# Patient Record
Sex: Male | Born: 1985 | Race: Black or African American | Hispanic: No | Marital: Single | Smoking: Current every day smoker
Health system: Southern US, Community
[De-identification: ages and names within clinical notes are randomized; demographics above are authoritative.]

---

## 2003-12-14 ENCOUNTER — Emergency Department (HOSPITAL_COMMUNITY): Admission: EM | Admit: 2003-12-14 | Discharge: 2003-12-14 | Payer: Self-pay | Admitting: Emergency Medicine

## 2004-08-04 ENCOUNTER — Emergency Department (HOSPITAL_COMMUNITY): Admission: EM | Admit: 2004-08-04 | Discharge: 2004-08-04 | Payer: Self-pay | Admitting: Emergency Medicine

## 2005-08-21 ENCOUNTER — Emergency Department (HOSPITAL_COMMUNITY): Admission: EM | Admit: 2005-08-21 | Discharge: 2005-08-21 | Payer: Self-pay | Admitting: *Deleted

## 2005-09-04 ENCOUNTER — Emergency Department (HOSPITAL_COMMUNITY): Admission: EM | Admit: 2005-09-04 | Discharge: 2005-09-04 | Payer: Self-pay | Admitting: Emergency Medicine

## 2005-09-05 ENCOUNTER — Emergency Department (HOSPITAL_COMMUNITY): Admission: EM | Admit: 2005-09-05 | Discharge: 2005-09-05 | Payer: Self-pay | Admitting: Emergency Medicine

## 2006-05-24 ENCOUNTER — Emergency Department (HOSPITAL_COMMUNITY): Admission: EM | Admit: 2006-05-24 | Discharge: 2006-05-24 | Payer: Self-pay | Admitting: Emergency Medicine

## 2008-03-26 IMAGING — CT CT HEAD W/O CM
3 of 4 series · 16 of 47 positions shown, 19 images · IV contrast (agent unspecified)
Comparison: No prior studies.

CLINICAL DATA: The patient fell and has facial and head pain along with photophobia.  
HEAD CT WITHOUT CONTRAST:
TECHNIQUE: Contiguous axial images were obtained from the base of the skull through the vertex according to standard protocol without contrast.
TECHNIQUE: Axial and coronal plane CT imaging was performed through the orbits.  No intravenous contrast was administered.

[Series 2: trauma head · axial · 0.47mm/px · z∈[+136,+263]mm · 10 of 28 slices shown, 13 images]
[im 2/28  brain]
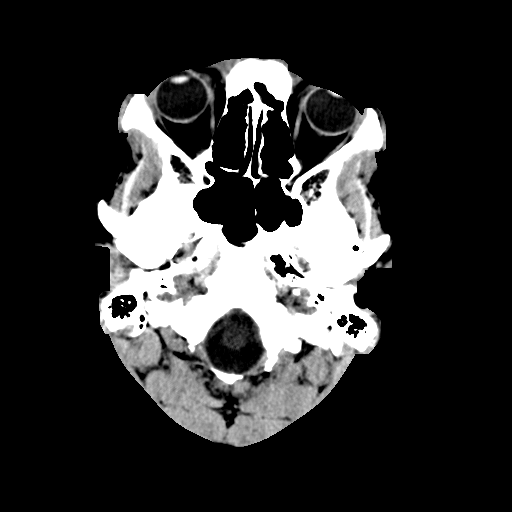
[im 2/28  bone]
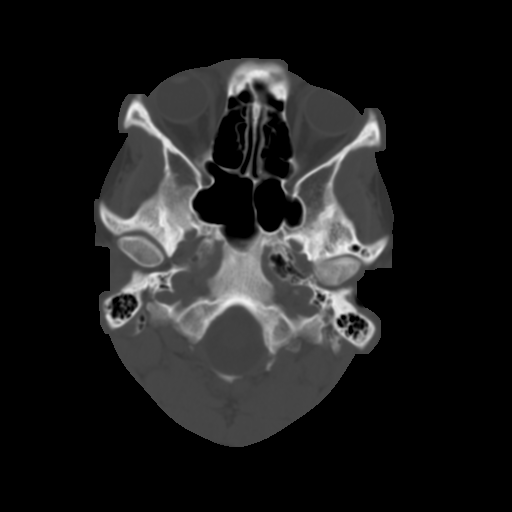
[im 4/28  brain]
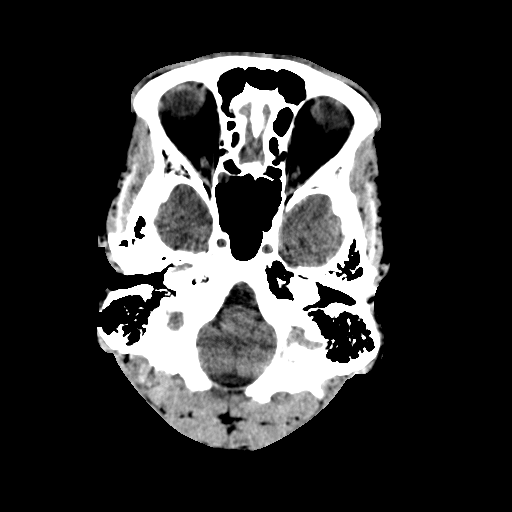
[im 8/28  brain]
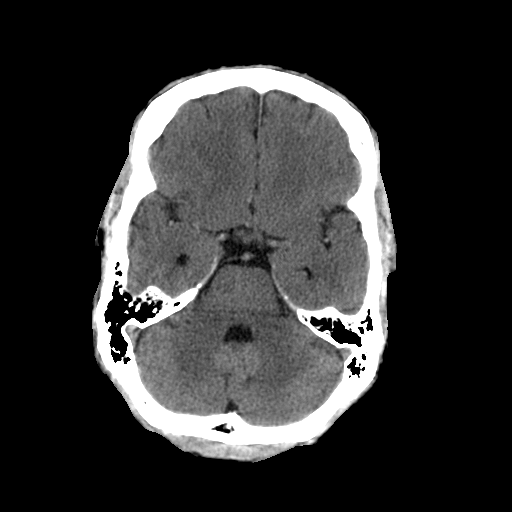
[im 10/28  brain]
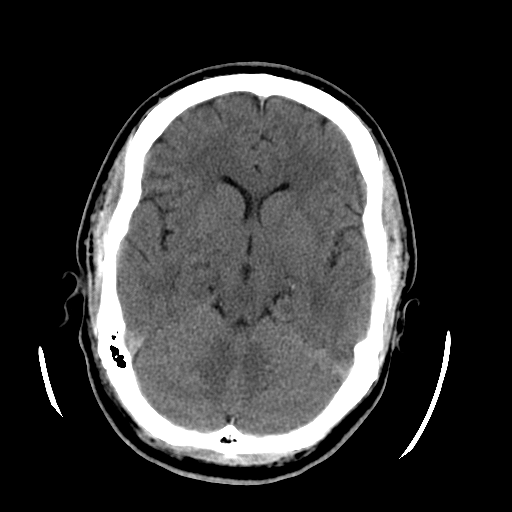
[im 12/28  brain]
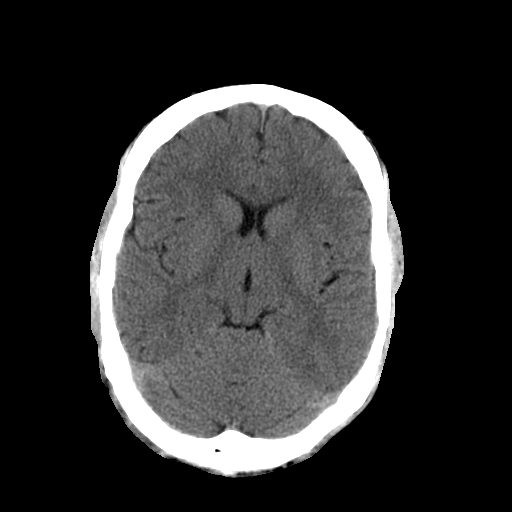
[im 12/28  bone]
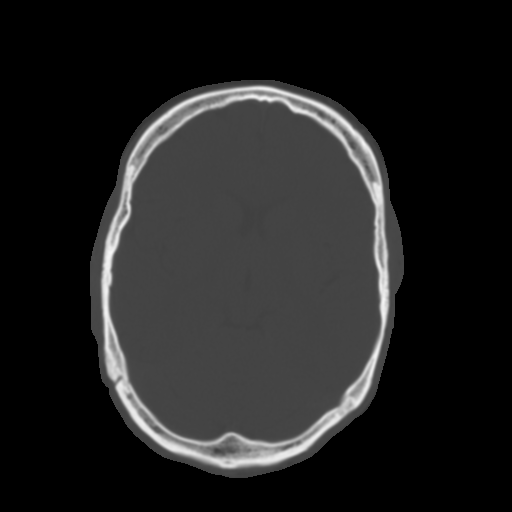
[im 16/28  brain]
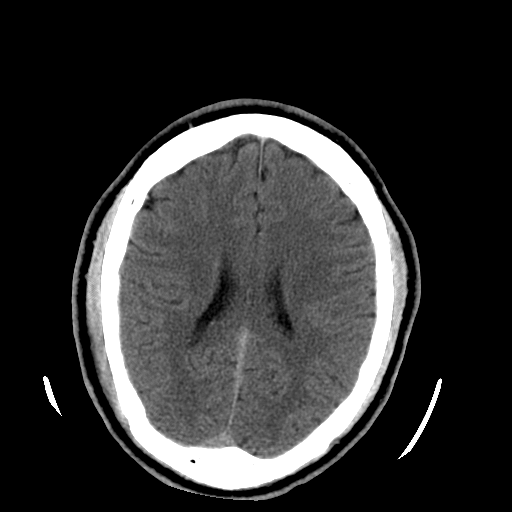
[im 18/28  brain]
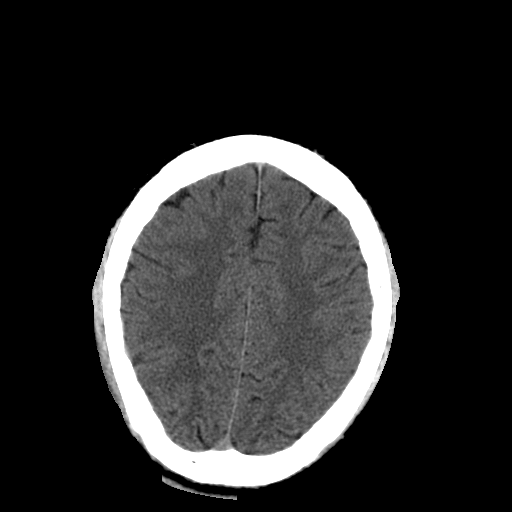
[im 20/28  brain]
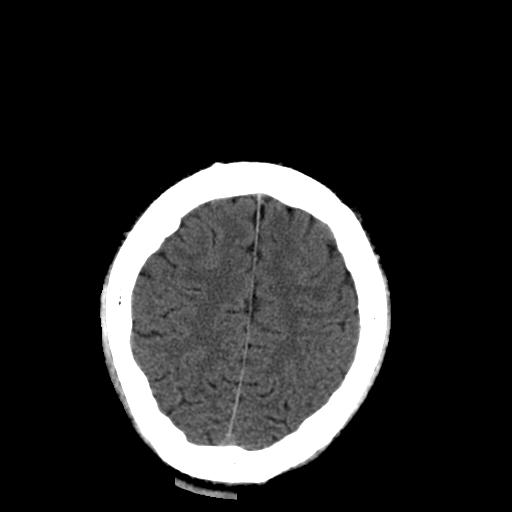
[im 24/28  brain]
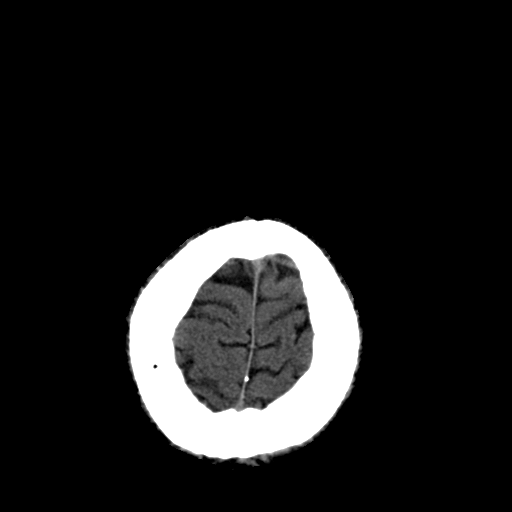
[im 24/28  bone]
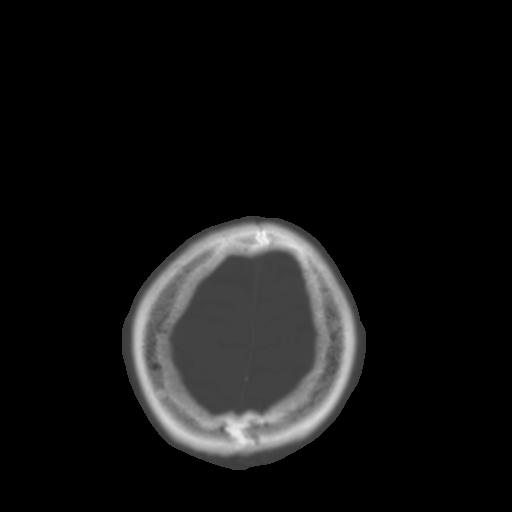
[im 26/28  brain]
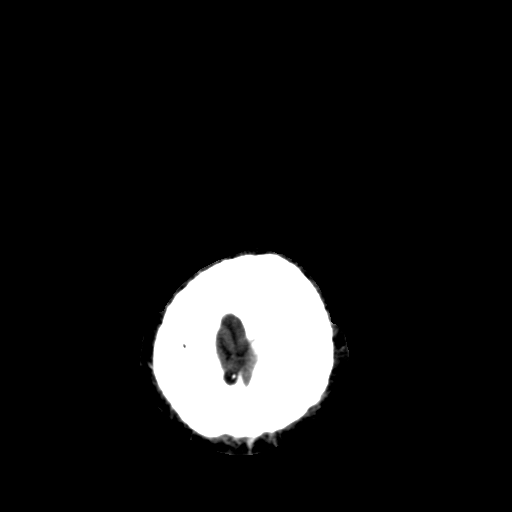

[Series 600: reformatted · sagittal · 0.33mm/px · 3 of 65 slices shown (1 of 2)]
[im 22/65  brain]
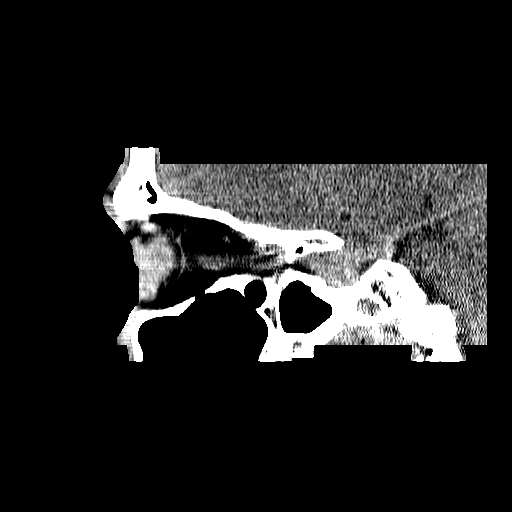
[im 33/65  brain]
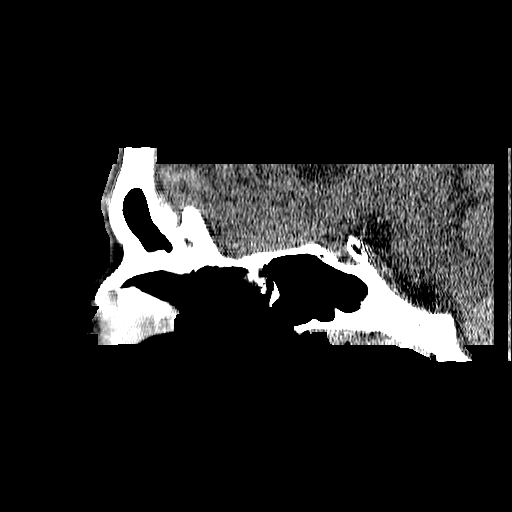
[im 43/65  brain]
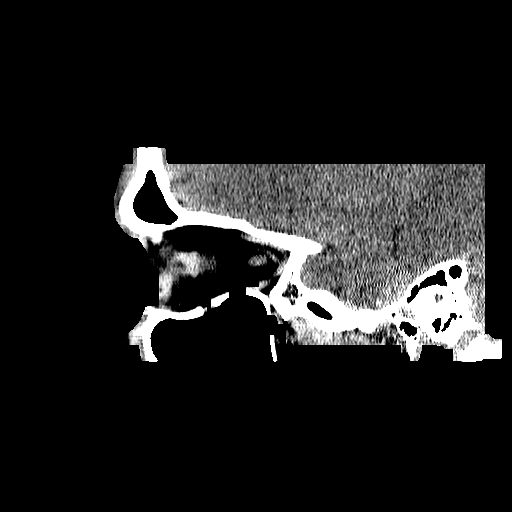

[Series 601: reformatted · coronal · 0.33mm/px · 3 of 45 slices shown (2 of 2)]
[im 15/45  brain]
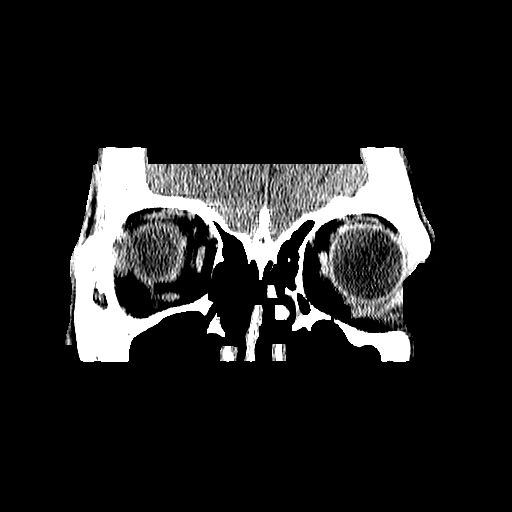
[im 20/45  brain]
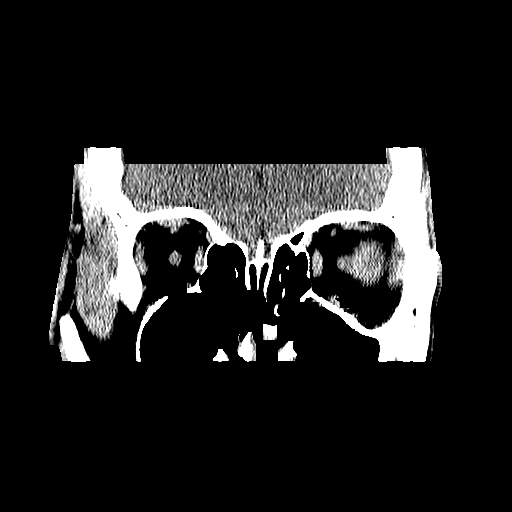
[im 25/45  brain]
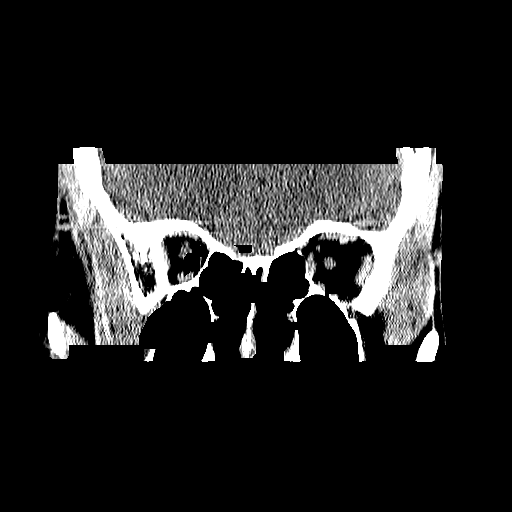

[16 of 47 positions shown; findings below may reference images not displayed]

FINDINGS: There is a small mucous retention cyst in the right sphenoid sinus.  There is also a suggestion of periorbital soft tissue swelling, without involvement of the intraorbital tissues.  No intracranial hemorrhage, mass lesion, or acute CVA is identified.  No acute intracranial findings are noted.
IMPRESSION: 1.  Mild chronic sinusitis.  
2.  Periorbital soft tissue swelling bilaterally.  
3.  No acute intracranial findings.
CT OF THE ORBITS WITHOUT CONTRAST:
FINDINGS: Periorbital soft tissue swelling is present bilaterally, right greater than left.  No extension into the orbit itself is identified.  The optic nerves and intraconal structures appear normal.  The globes appear grossly intact.  There is mild chronic right sphenoid and left maxillary sinusitis.  
There is a nondisplaced fracture of the right nasal bone.
IMPRESSION: 1.  Periorbital soft tissue swelling.  Nondisplaced fracture of the right nasal bone.    No orbital fracture is identified. 
2.  Mild chronic paranasal sinusitis.

## 2012-08-22 ENCOUNTER — Emergency Department (HOSPITAL_COMMUNITY)
Admission: EM | Admit: 2012-08-22 | Discharge: 2012-08-22 | Disposition: A | Discharge status: Home / Self Care | Payer: Self-pay | Attending: Emergency Medicine | Admitting: Emergency Medicine

## 2012-08-22 ENCOUNTER — Encounter (HOSPITAL_COMMUNITY): Payer: Self-pay | Admitting: *Deleted

## 2012-08-22 DIAGNOSIS — R21 Rash and other nonspecific skin eruption: Secondary | ICD-10-CM | POA: Insufficient documentation

## 2012-08-22 DIAGNOSIS — S40021A Contusion of right upper arm, initial encounter: Secondary | ICD-10-CM

## 2012-08-22 DIAGNOSIS — S61219A Laceration without foreign body of unspecified finger without damage to nail, initial encounter: Secondary | ICD-10-CM

## 2012-08-22 DIAGNOSIS — S40029A Contusion of unspecified upper arm, initial encounter: Secondary | ICD-10-CM | POA: Insufficient documentation

## 2012-08-22 DIAGNOSIS — F17200 Nicotine dependence, unspecified, uncomplicated: Secondary | ICD-10-CM | POA: Insufficient documentation

## 2012-08-22 DIAGNOSIS — S61209A Unspecified open wound of unspecified finger without damage to nail, initial encounter: Secondary | ICD-10-CM | POA: Insufficient documentation
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
## 2012-08-24 NOTE — ED Provider Notes (Signed)
Medical screening examination/treatment/procedure(s) were performed by non-physician practitioner and as supervising physician I was immediately available for consultation/collaboration.  Lanisha Stepanian R. Shae Augello, MD 08/24/12 0013 

## 2018-01-20 ENCOUNTER — Emergency Department (HOSPITAL_COMMUNITY)
Admission: EM | Admit: 2018-01-20 | Discharge: 2018-01-20 | Disposition: A | Discharge status: Home / Self Care | Payer: Self-pay | Attending: Emergency Medicine | Admitting: Emergency Medicine

## 2018-01-20 ENCOUNTER — Encounter (HOSPITAL_COMMUNITY): Payer: Self-pay

## 2018-01-20 ENCOUNTER — Other Ambulatory Visit: Payer: Self-pay

## 2018-01-20 DIAGNOSIS — F17210 Nicotine dependence, cigarettes, uncomplicated: Secondary | ICD-10-CM | POA: Insufficient documentation

## 2018-01-20 DIAGNOSIS — K0889 Other specified disorders of teeth and supporting structures: Secondary | ICD-10-CM

## 2018-01-20 DIAGNOSIS — K047 Periapical abscess without sinus: Secondary | ICD-10-CM | POA: Insufficient documentation

## 2018-01-20 MED ORDER — CLINDAMYCIN HCL 150 MG PO CAPS: 450.0000 mg | ORAL_CAPSULE | Freq: Three times a day (TID) | ORAL | 0 refills | Status: AC

## 2018-01-20 MED ORDER — HYDROCODONE-ACETAMINOPHEN 5-325 MG PO TABS
1.0000 | ORAL_TABLET | Freq: Once | ORAL | Status: AC
  Administered 2018-01-20: 1 via ORAL
  Filled 2018-01-20: qty 1

## 2018-01-20 NOTE — Discharge Instructions (Signed)
I have prescribed antibiotics for your pain, please take 3 tablets three times a day for the next 7 days.I have provided a referral for the dental clinic at [facility], please schedule an appointment for your dental needs.

## 2018-01-20 NOTE — ED Provider Notes (Signed)
[facility]-EMERGENCY DEPT Provider Note   CSN: [id number] Arrival date & time: 01/20/18  4696     History   Chief Complaint Chief Complaint  Patient presents with  . Dental Pain  . Oral Swelling    HPI Eric Mathis is a 32 y.o. male.  33 y/o male with a no PMH presents to the ED with a chief complaint of right tooth pain which began two days ago.Patient reports pain on the lower aspect of the right side of his mouth.  Reports the pain is worse with talking, mastication but he is able to tolerate liquids and solids at this time.  Reports the pain radiates to the lower aspect of his neck.  He has tried ibuprofen 800 mg along with Tylenol but states no relieving symptoms.  He also reports he had a fever yesterday but did not measure his temperature as he felt hot, subjective.  He denies any difficulty swallowing, facial swelling,headache. Patient has not been to a dentist in "couple of years".      History reviewed. No pertinent past medical history.  There are no active problems to display for this patient.   History reviewed. No pertinent surgical history.      Home Medications    Prior to Admission medications   Medication Sig Start Date End Date Taking? Authorizing Provider  clindamycin (CLEOCIN) 150 MG capsule Take 3 capsules (450 mg total) by mouth 3 (three) times daily for 7 days. 01/20/18 01/27/18  Claude Manges, PA-C    Family History History reviewed. No pertinent family history.  Social History Social History   Tobacco Use  . Smoking status: Current Every Day Smoker    Packs/day: 0.50    Types: Cigarettes  . Smokeless tobacco: Never Used  Substance Use Topics  . Alcohol use: No  . Drug use: No     Allergies   Patient has no known allergies.   Review of Systems Review of Systems  Constitutional: Positive for fever (subjective, none recordered in ED). Negative for chills.  HENT: Positive for dental problem. Negative for  trouble swallowing and voice change.   All other systems reviewed and are negative.    Physical Exam Updated Vital Signs BP (!) 158/93 (BP Location: Right Arm)   Pulse 64   Temp 98.2 F (36.8 C) (Oral)   Resp 16   Ht 5\' 7"  (1.702 m)   Wt 122.5 kg   SpO2 100%   BMI 42.29 kg/m   Physical Exam  Constitutional: He is oriented to person, place, and time. He appears well-developed and well-nourished.  HENT:  Mouth/Throat: Uvula is midline, oropharynx is clear and moist and mucous membranes are normal. Abnormal dentition. Dental abscesses and dental caries present. No uvula swelling. No tonsillar exudate.    There are a couple of broken teeth in the back right side of his mouth erythema present surrounding teeth region. A small periapical abscess is present on the gum line.   Neck: Normal range of motion and full passive range of motion without pain. Neck supple. No neck rigidity. Normal range of motion present.  Cardiovascular: Normal heart sounds.  Pulmonary/Chest: Breath sounds normal. He has no wheezes.  Abdominal: Soft. There is no tenderness.  Musculoskeletal: He exhibits no tenderness.  Lymphadenopathy:       Head (right side): Submandibular adenopathy present. No submental and no tonsillar adenopathy present.       Right cervical: No superficial cervical, no deep cervical and no posterior cervical  adenopathy present. Submandibular adenopathy present, tenderness to palpation.   Neurological: He is alert and oriented to person, place, and time.  Skin: Skin is warm and dry.  Nursing note and vitals reviewed.    ED Treatments / Results  Labs (all labs ordered are listed, but only abnormal results are displayed) Labs Reviewed - No data to display  EKG None  Radiology No results found.  Procedures Procedures (including critical care time)  Medications Ordered in ED Medications  HYDROcodone-acetaminophen (NORCO/VICODIN) 5-325 MG per tablet 1 tablet (1 tablet Oral  Given 01/20/18 1610)     Initial Impression / Assessment and Plan / ED Course  I have reviewed the triage vital signs and the nursing notes.  Pertinent labs & imaging results that were available during my care of the patient were reviewed by me and considered in my medical decision making (see chart for details).    Patient presents with dental pain which began two days ago.Upon examination there is erythema surrounding both of his chipped teeth, there is a small periapical abscess on the gumline.  Patient states he was febrile yesterday but does not recall his temperature as he did not measure it with a thermometer but reports feeling hot. He has not seen a dentist in multiple years.  He also reports some right submandibular swelling there is no tonsillar lymphadenopathy present.  There are no tonsillar lymphadenopathy present upon examination  At this time will provide patient with pain relief while in the ED along with sending home on antibiotics and a referral to the [facility] dental clinic as patient does not have resources to obtain dental care.  Patient understands and agrees with plan.  Return precautions provided for patient at length.  Final Clinical Impressions(s) / ED Diagnoses   Final diagnoses:  Dental abscess  Pain, dental    ED Discharge Orders         Ordered    clindamycin (CLEOCIN) 150 MG capsule  3 times daily     01/20/18 0919           Claude Manges, PA-C 01/20/18 9604    Derwood Kaplan, MD 01/21/18 [phone]

## 2018-10-31 ENCOUNTER — Emergency Department (HOSPITAL_COMMUNITY)
Admission: EM | Admit: 2018-10-31 | Discharge: 2018-10-31 | Disposition: A | Discharge status: Home / Self Care | Payer: Worker's Compensation | Attending: Emergency Medicine | Admitting: Emergency Medicine

## 2018-10-31 ENCOUNTER — Emergency Department (HOSPITAL_COMMUNITY): Payer: Worker's Compensation

## 2018-10-31 ENCOUNTER — Other Ambulatory Visit: Payer: Self-pay

## 2018-10-31 DIAGNOSIS — Y939 Activity, unspecified: Secondary | ICD-10-CM | POA: Insufficient documentation

## 2018-10-31 DIAGNOSIS — S99911A Unspecified injury of right ankle, initial encounter: Secondary | ICD-10-CM | POA: Diagnosis present

## 2018-10-31 DIAGNOSIS — S93401A Sprain of unspecified ligament of right ankle, initial encounter: Secondary | ICD-10-CM | POA: Insufficient documentation

## 2018-10-31 DIAGNOSIS — Y999 Unspecified external cause status: Secondary | ICD-10-CM | POA: Diagnosis not present

## 2018-10-31 DIAGNOSIS — Y929 Unspecified place or not applicable: Secondary | ICD-10-CM | POA: Insufficient documentation

## 2018-10-31 DIAGNOSIS — X501XXA Overexertion from prolonged static or awkward postures, initial encounter: Secondary | ICD-10-CM | POA: Diagnosis not present

## 2018-10-31 DIAGNOSIS — F17210 Nicotine dependence, cigarettes, uncomplicated: Secondary | ICD-10-CM | POA: Diagnosis not present

## 2018-10-31 MED ORDER — IBUPROFEN 400 MG PO TABS
600.0000 mg | ORAL_TABLET | Freq: Once | ORAL | Status: AC
  Administered 2018-10-31: 600 mg via ORAL
  Filled 2018-10-31: qty 1

## 2018-10-31 NOTE — ED Notes (Signed)
Ortho called. Coming to apply ASO and crutches

## 2018-10-31 NOTE — ED Provider Notes (Signed)
[facility] EMERGENCY DEPARTMENT Provider Note   CSN: [id number] Arrival date & time: 10/31/18  1529     History   Chief Complaint Chief Complaint  Patient presents with  . Ankle Pain    HPI Eric Mathis is a 33 y.o. male presenting for evaluation of ankle pain.  Patient states just prior to arrival he was at work when his ankle twisted, it everted.  He reports acute onset pain and swelling.  He has not had any work including Tylenol ibuprofen.  He denies hitting his head or loss of consciousness.  He denies injury elsewhere.  Pain is been constant, worse with movement and palpation.  Nothing makes it better.  No radiation.  No numbness or tingling.  He has no medical problems, takes no medications daily.  He is not on blood thinners.     HPI  No past medical history on file.  There are no active problems to display for this patient.   No past surgical history on file.      Home Medications    Prior to Admission medications   Not on File    Family History No family history on file.  Social History Social History   Tobacco Use  . Smoking status: Current Every Day Smoker    Packs/day: 0.50    Types: Cigarettes  . Smokeless tobacco: Never Used  Substance Use Topics  . Alcohol use: No  . Drug use: No     Allergies   Patient has no known allergies.   Review of Systems Review of Systems  Musculoskeletal: Positive for arthralgias and joint swelling.  Neurological: Negative for numbness.     Physical Exam Updated Vital Signs BP 125/77 (BP Location: Right Arm)   Pulse 70   Temp 98.4 F (36.9 C) (Oral)   Resp 16   SpO2 97%   Physical Exam Vitals signs and nursing note reviewed.  Constitutional:      General: He is not in acute distress.    Appearance: He is well-developed.  HENT:     Head: Normocephalic and atraumatic.  Neck:     Musculoskeletal: Normal range of motion.  Pulmonary:     Effort: Pulmonary effort is normal.   Abdominal:     General: There is no distension.  Musculoskeletal:        General: Swelling and tenderness present.     Comments: Obvious swelling of R ankle medially and laterally.  Tenderness to palpation over medial and lateral malleolus.  Achilles tendon palpable and intact.  Pedal pulses intact bilaterally.  No swelling or pain of the foot.  Good cap refill and sensation.  No tenderness palpation of calf or knee.  Skin:    General: Skin is warm.     Capillary Refill: Capillary refill takes less than 2 seconds.     Findings: No rash.  Neurological:     Mental Status: He is alert and oriented to person, place, and time.      ED Treatments / Results  Labs (all labs ordered are listed, but only abnormal results are displayed) Labs Reviewed - No data to display  EKG None  Radiology Dg Ankle Complete Right  Result Date: 10/31/2018 CLINICAL DATA:  Pt c/o medial and lateral right ankle pain after falling and twisting his ankle while at work today. No hx of prior injuries or surgeries to the area. Patient and tech wore masks. EXAM: RIGHT ANKLE - COMPLETE 3+ VIEW COMPARISON:  None. FINDINGS:  There is moderate soft tissue swelling of the ankle, LATERAL greater than MEDIAL. There is no acute fracture or subluxation. The mortise is intact. No radiopaque foreign body or soft tissue gas. IMPRESSION: Soft tissue swelling. Electronically Signed   By: Norva PavlovElizabeth  Brown M.D.   On: 10/31/2018 16:52    Procedures Procedures (including critical care time)  Medications Ordered in ED Medications  ibuprofen (ADVIL) tablet 600 mg (600 mg Oral Given 10/31/18 1623)     Initial Impression / Assessment and Plan / ED Course  I have reviewed the triage vital signs and the nursing notes.  Pertinent labs & imaging results that were available during my care of the patient were reviewed by me and considered in my medical decision making (see chart for details).        Patient presenting for evaluation of  right ankle pain and swelling.  Physical exam reassuring, he is neurovascularly intact.  X-rays viewed and interpreted by me, no fracture or dislocation.  Likely sprain.  Discussed findings with patient.  Discussed Intermatic treatment Tylenol, ibuprofen, rest, ice, elevation.  ASO and crutches as needed for symptom control.  At this time, patient appears safe for discharge.  Return precautions given.  Patient states he understands and agrees to plan.  Final Clinical Impressions(s) / ED Diagnoses   Final diagnoses:  Sprain of right ankle, unspecified ligament, initial encounter    ED Discharge Orders    None       Alveria ApleyCaccavale, Kostantinos Tallman, PA-C 10/31/18 1958    Arby BarrettePfeiffer, Marcy, MD 11/01/18 1125

## 2018-10-31 NOTE — Discharge Instructions (Signed)
1. Medications: Take ibuprofen 3 times a day with meals.  Do not take other anti-inflammatories at the same time (Advil, Motrin, naproxen, Aleve). You may supplement with Tylenol if you need further pain control. 2. Treatment: Wear ankle brace as needed for stabilization of ankle. Use crutches as needed for comfort. Ice and elevate ankle throughout the day.  3. Follow-up: Call orthopedic follow up today today or tomorrow to schedule followup appointment for recheck of ongoing ankle pain that can be canceled with a 24-48 hour notice if complete resolution of pain.

## 2018-10-31 NOTE — ED Triage Notes (Signed)
Pt arrives after tripping at work and twisting his right ankle- right ankle swollen and painful

## 2019-03-19 ENCOUNTER — Encounter (HOSPITAL_COMMUNITY): Payer: Self-pay | Admitting: Emergency Medicine

## 2019-03-19 ENCOUNTER — Emergency Department (HOSPITAL_COMMUNITY): Payer: Self-pay

## 2019-03-19 ENCOUNTER — Other Ambulatory Visit: Payer: Self-pay

## 2019-03-19 ENCOUNTER — Emergency Department (HOSPITAL_COMMUNITY)
Admission: EM | Admit: 2019-03-19 | Discharge: 2019-03-19 | Disposition: A | Discharge status: Home / Self Care | Payer: Self-pay | Attending: Emergency Medicine | Admitting: Emergency Medicine

## 2019-03-19 DIAGNOSIS — N451 Epididymitis: Secondary | ICD-10-CM | POA: Insufficient documentation

## 2019-03-19 DIAGNOSIS — Z113 Encounter for screening for infections with a predominantly sexual mode of transmission: Secondary | ICD-10-CM | POA: Insufficient documentation

## 2019-03-19 DIAGNOSIS — F17210 Nicotine dependence, cigarettes, uncomplicated: Secondary | ICD-10-CM | POA: Insufficient documentation

## 2019-03-19 LAB — COMPREHENSIVE METABOLIC PANEL
ALT: 17 U/L (ref 0–44)
AST: 14 U/L — ABNORMAL LOW (ref 15–41)
Albumin: 3.5 g/dL (ref 3.5–5.0)
Alkaline Phosphatase: 97 U/L (ref 38–126)
Anion gap: 10 (ref 5–15)
BUN: 5 mg/dL — ABNORMAL LOW (ref 6–20)
CO2: 26 mmol/L (ref 22–32)
Calcium: 9.1 mg/dL (ref 8.9–10.3)
Chloride: 104 mmol/L (ref 98–111)
Creatinine, Ser: 1.13 mg/dL (ref 0.61–1.24)
GFR calc Af Amer: >60 mL/min (ref >60)
GFR calc non Af Amer: >60 mL/min (ref >60)
Glucose, Bld: 107 mg/dL — ABNORMAL HIGH (ref 70–99)
Potassium: 3.6 mmol/L (ref 3.5–5.1)
Sodium: 140 mmol/L (ref 135–145)
Total Bilirubin: 0.5 mg/dL (ref 0.3–1.2)
Total Protein: 7.9 g/dL (ref 6.5–8.1)

## 2019-03-19 LAB — URINALYSIS, ROUTINE W REFLEX MICROSCOPIC
Bilirubin Urine: NEGATIVE
Glucose, UA: NEGATIVE mg/dL
Ketones, ur: NEGATIVE mg/dL
Nitrite: NEGATIVE
Protein, ur: 30 mg/dL — AB
Specific Gravity, Urine: 1.013 (ref 1.005–1.030)
WBC, UA: >50 WBC/hpf — ABNORMAL HIGH (ref 0–5)
pH: 6 (ref 5.0–8.0)

## 2019-03-19 LAB — CBC
HCT: 47.4 % (ref 39.0–52.0)
Hemoglobin: 15.7 g/dL (ref 13.0–17.0)
MCH: 30.5 pg (ref 26.0–34.0)
MCHC: 33.1 g/dL (ref 30.0–36.0)
MCV: 92 fL (ref 80.0–100.0)
Platelets: 322 10*3/uL (ref 150–400)
RBC: 5.15 MIL/uL (ref 4.22–5.81)
RDW: 13.1 % (ref 11.5–15.5)
WBC: 11.3 10*3/uL — ABNORMAL HIGH (ref 4.0–10.5)
nRBC: 0 % (ref 0.0–0.2)

## 2019-03-19 LAB — LIPASE, BLOOD: Lipase: 29 U/L (ref 11–51)

## 2019-03-19 LAB — HIV ANTIBODY (ROUTINE TESTING W REFLEX): HIV Screen 4th Generation wRfx: NONREACTIVE

## 2019-03-19 MED ORDER — HYDROCODONE-ACETAMINOPHEN 5-325 MG PO TABS: 1.0000 | ORAL_TABLET | Freq: Four times a day (QID) | ORAL | 0 refills | Status: DC | PRN

## 2019-03-19 MED ORDER — DOXYCYCLINE HYCLATE 100 MG PO CAPS
100.0000 mg | ORAL_CAPSULE | Freq: Two times a day (BID) | ORAL | 0 refills | Status: DC
Start: 2019-03-19 — End: 2021-12-05

## 2019-03-19 MED ORDER — IBUPROFEN 800 MG PO TABS
800.0000 mg | ORAL_TABLET | Freq: Once | ORAL | Status: AC
  Administered 2019-03-19: 800 mg via ORAL
  Filled 2019-03-19: qty 1

## 2019-03-19 MED ORDER — DOXYCYCLINE HYCLATE 100 MG PO TABS
100.0000 mg | ORAL_TABLET | Freq: Once | ORAL | Status: AC
  Administered 2019-03-19: 13:00:00 100 mg via ORAL
  Filled 2019-03-19: qty 1

## 2019-03-19 MED ORDER — SODIUM CHLORIDE 0.9% FLUSH: 3.0000 mL | Freq: Once | INTRAVENOUS | Status: DC

## 2019-03-19 MED ORDER — CEFTRIAXONE SODIUM 250 MG IJ SOLR
250.0000 mg | Freq: Once | INTRAMUSCULAR | Status: AC
  Administered 2019-03-19: 250 mg via INTRAMUSCULAR
  Filled 2019-03-19: qty 250

## 2019-03-19 MED ORDER — STERILE WATER FOR INJECTION IJ SOLN
INTRAMUSCULAR | Status: AC
  Administered 2019-03-19: 13:00:00 10 mL
  Filled 2019-03-19: qty 10

## 2019-03-19 MED ORDER — NAPROXEN 500 MG PO TABS: 500.0000 mg | ORAL_TABLET | Freq: Two times a day (BID) | ORAL | 0 refills | Status: DC

## 2019-03-19 NOTE — ED Notes (Signed)
Pt giving urine sample now.

## 2019-03-19 NOTE — Discharge Instructions (Addendum)
I am sending you home with an antibiotic and 2 pain medications. Take all as prescribed. Finish all antibiotics. The naproxen can be taken twice a day. Only use the norco for severe pain. I have included the urologist phone number. If your symptoms do not improve within the next few days, call to schedule an appointment. Return to the ER if you have difficulties urinating, difficulties with bowel movements, develop a fever, or worsen pain.

## 2019-03-19 NOTE — ED Provider Notes (Signed)
[facility] EMERGENCY DEPARTMENT Provider Note   CSN: [id number] Arrival date & time: 03/19/19  8756     History   Chief Complaint Chief Complaint  Patient presents with   Abdominal Pain   Testicle Pain    HPI Eric Mathis is a 33 y.o. male with no significant past medical history who presents to the ED due to gradual onset of left lower quadrant pain and left testicular pain for the past 2 days.  Patient notes pain has gradually gotten worse over the past few days. Patient describes his pain as throbbing in nature and rates it a 10/10.  He has tried Aleve with moderate relief.  Denies trauma.  Pain is associated with white penile discharge for the past few days and chills.  Patient is currently sexually active with one partner without protection.  Patient denies urinary symptoms. No history of DM. Patient denies fever, chills, nausea, vomiting, diarrhea, chest pain, and shortness of breath.   History reviewed. No pertinent past medical history.  There are no active problems to display for this patient.   History reviewed. No pertinent surgical history.      Home Medications    Prior to Admission medications   Medication Sig Start Date End Date Taking? Authorizing Provider  doxycycline (VIBRAMYCIN) 100 MG capsule Take 1 capsule (100 mg total) by mouth 2 (two) times daily. 03/19/19   Cheek, Comer Locket, PA-C  HYDROcodone-acetaminophen (NORCO/VICODIN) 5-325 MG tablet Take 1 tablet by mouth every 6 (six) hours as needed for severe pain. 03/19/19   Cheek, Comer Locket, PA-C  naproxen (NAPROSYN) 500 MG tablet Take 1 tablet (500 mg total) by mouth 2 (two) times daily. 03/19/19   Jonette Eva, PA-C    Family History No family history on file.  Social History Social History   Tobacco Use   Smoking status: Current Every Day Smoker    Packs/day: 0.50    Types: Cigarettes   Smokeless tobacco: Never Used  Substance Use Topics   Alcohol use: No    Drug use: No     Allergies   Patient has no known allergies.   Review of Systems Review of Systems  Constitutional: Negative for chills and fever.  Gastrointestinal: Positive for abdominal pain (LLQ). Negative for diarrhea, nausea and vomiting.  Genitourinary: Positive for discharge, scrotal swelling and testicular pain. Negative for difficulty urinating, dysuria, flank pain and hematuria.  Musculoskeletal: Negative for back pain.  All other systems reviewed and are negative.    Physical Exam Updated Vital Signs BP 136/85 (BP Location: Right Arm)    Pulse (!) 110    Temp 99 F (37.2 C) (Oral)    Resp 14    SpO2 99%   Physical Exam Vitals signs and nursing note reviewed. Exam conducted with a chaperone present.  Constitutional:      General: He is not in acute distress.    Appearance: He is not toxic-appearing.  HENT:     Head: Normocephalic.  Eyes:     Conjunctiva/sclera: Conjunctivae normal.  Neck:     Musculoskeletal: Neck supple.  Cardiovascular:     Rate and Rhythm: Regular rhythm. Tachycardia present.     Pulses: Normal pulses.     Heart sounds: Normal heart sounds. No murmur. No friction rub. No gallop.   Pulmonary:     Effort: Pulmonary effort is normal.     Breath sounds: Normal breath sounds.  Abdominal:     General: Abdomen is flat. Bowel sounds  are normal. There is no distension.     Palpations: Abdomen is soft.     Tenderness: There is abdominal tenderness. There is no right CVA tenderness, left CVA tenderness, guarding or rebound.     Comments: Focal LLQ tenderness. Negative McBurney's point tenderness. Negative Murphy's sign. No peritoneal signs.  Genitourinary:    Penis: Normal and circumcised. No erythema or discharge.      Scrotum/Testes:        Right: Tenderness or swelling not present.        Left: Tenderness and swelling present.     Epididymis:     Left: Enlarged. Tenderness present.  Musculoskeletal:     Comments: Able to move all 4  extremities without difficulty. No lower extremity edema. Pulses and sensation intact bilaterally.  Lymphadenopathy:     Lower Body: No right inguinal adenopathy. No left inguinal adenopathy.  Skin:    General: Skin is warm and dry.  Neurological:     General: No focal deficit present.     Mental Status: He is alert.      ED Treatments / Results  Labs (all labs ordered are listed, but only abnormal results are displayed) Labs Reviewed  COMPREHENSIVE METABOLIC PANEL - Abnormal; Notable for the following components:      Result Value   Glucose, Bld 107 (*)    BUN 5 (*)    AST 14 (*)    All other components within normal limits  CBC - Abnormal; Notable for the following components:   WBC 11.3 (*)    All other components within normal limits  URINALYSIS, ROUTINE W REFLEX MICROSCOPIC - Abnormal; Notable for the following components:   APPearance CLOUDY (*)    Hgb urine dipstick SMALL (*)    Protein, ur 30 (*)    Leukocytes,Ua LARGE (*)    WBC, UA >50 (*)    Bacteria, UA RARE (*)    All other components within normal limits  LIPASE, BLOOD  HIV ANTIBODY (ROUTINE TESTING W REFLEX)  RPR  GC/CHLAMYDIA PROBE AMP ([facility]    EKG None  Radiology Kore[place] Scrotum W/doppler  Result Date: 03/19/2019 CLINICAL DATA:  Left-sided scrotal pain and swelling. EXAM: SCROTAL ULTRASOUND DOPPLER ULTRASOUND OF THE TESTICLES TECHNIQUE: Complete ultrasound examination of the testicles, epididymis, and other scrotal structures was performed. Color and spectral Doppler ultrasound were also utilized to evaluate blood flow to the testicles. COMPARISON:  None. FINDINGS: Right testicle Measurements: 4.0 x 2.2 x 3.0 cm. No mass or microlithiasis visualized. Left testicle Measurements: 3.3 x 2.0 x 2.6 cm. No mass or microlithiasis visualized. Right epididymis:  Normal in size and appearance. Left epididymis: Enlarged and heterogeneous in echotexture with hyperemia on Doppler assessment. Hydrocele:   None visualized. Varicocele:  None visualized. Pulsed Doppler interrogation of both testes demonstrates normal low resistance arterial and venous waveforms bilaterally. IMPRESSION: 1. Enlarged heterogeneous left epididymis with hyperemia and overlying thickening of the scrotum. Imaging features most suggestive of left epididymo-orchitis. Electronically Signed   By: Kennith CenterEric  Mansell M.D.   On: 03/19/2019 11:03    Procedures Procedures (including critical care time)  Medications Ordered in ED Medications  sodium chloride flush (NS) 0.9 % injection 3 mL (has no administration in time range)  ibuprofen (ADVIL) tablet 800 mg (800 mg Oral Given 03/19/19 1239)  cefTRIAXone (ROCEPHIN) injection 250 mg (250 mg Intramuscular Given 03/19/19 1240)  doxycycline (VIBRA-TABS) tablet 100 mg (100 mg Oral Given 03/19/19 1239)  sterile water (preservative free) injection (  10 mLs  Given 03/19/19 1240)     Initial Impression / Assessment and Plan / ED Course  I have reviewed the triage vital signs and the nursing notes.  Pertinent labs & imaging results that were available during my care of the patient were reviewed by me and considered in my medical decision making (see chart for details).       33 year old male presents to the ED due to gradual onset of worsening left testicular pain. Patient denies trauma. Patient is afebrile and tachycardic at 107 likely due to pain. Patient in no acute distress, but appears very uncomfortable. Abdomen soft, non-distended with LLQ tenderness. No peritoneal signs. Left scrotum and testicle with edema and erythema. Tenderness to palpation over left epididymis. Patient denies urinary symptoms. STD test pending.   Labs and imaging reviewed. [place] concerning for ependymitis-orchitis. No concern for Fourneir's gangrene at this time. CBC with leukocytosis of 11.3, but otherwise reassuring. CMP reassuring. UA with large amounts of leukocytes, but rare bacteria likely due to  contamination. Patient has no urinary symptoms, so doubt UTI.  Patient treated in ER w 1g rocephin and doxycyline. Patient will be dc doxycycline 500 BID x 10 days. Pt denies a hx of renal issues or rectal pain. Will place on Naproxen 500 BID to help pain and Norco for breakthrough pain. Patient has been advised to rest, ice and scrotal support to help he is pain. Recommended purchasing jockstrap. Urology number given to patient at discharge. Patient has been advised to follow-up with urology if symptoms do not improve within the next few days. Presentation non-concerning for testicular torsion or prostatitis. Patient is hemodynamically stable and in no acute distress prior to discharge. Strict ED precautions discussed with patient. Patient states understanding and agrees to plan. Patient discharged home in no acute distress.   Final Clinical Impressions(s) / ED Diagnoses   Final diagnoses:  Epididymitis  Screening examination for STD (sexually transmitted disease)    ED Discharge Orders         Ordered    doxycycline (VIBRAMYCIN) 100 MG capsule  2 times daily     03/19/19 1307    naproxen (NAPROSYN) 500 MG tablet  2 times daily     03/19/19 1307    HYDROcodone-acetaminophen (NORCO/VICODIN) 5-325 MG tablet  Every 6 hours PRN     03/19/19 1307           Lorelle Formosa 03/19/19 1333    Tegeler, Canary Brim, MD 03/19/19 1732

## 2019-03-19 NOTE — ED Triage Notes (Signed)
C/o LLQ pain and L testicle pain and swelling x 2 days.  Denies nausea, vomiting, diarrhea, and urinary complaints.

## 2019-03-20 LAB — RPR: RPR Ser Ql: NONREACTIVE

## 2019-03-21 LAB — GC/CHLAMYDIA PROBE AMP (~~LOC~~) NOT AT ARMC
Chlamydia: NEGATIVE
Neisseria Gonorrhea: POSITIVE — AB

## 2020-05-01 ENCOUNTER — Ambulatory Visit (HOSPITAL_COMMUNITY)
Admission: EM | Admit: 2020-05-01 | Discharge: 2020-05-01 | Disposition: A | Discharge status: Home / Self Care | Payer: HRSA Program | Attending: Family Medicine | Admitting: Family Medicine

## 2020-05-01 ENCOUNTER — Encounter (HOSPITAL_COMMUNITY): Payer: Self-pay

## 2020-05-01 DIAGNOSIS — R5383 Other fatigue: Secondary | ICD-10-CM | POA: Diagnosis present

## 2020-05-01 DIAGNOSIS — U071 COVID-19: Secondary | ICD-10-CM | POA: Diagnosis not present

## 2020-05-01 DIAGNOSIS — F17210 Nicotine dependence, cigarettes, uncomplicated: Secondary | ICD-10-CM | POA: Diagnosis not present

## 2020-05-01 DIAGNOSIS — R52 Pain, unspecified: Secondary | ICD-10-CM

## 2020-05-01 LAB — SARS CORONAVIRUS 2 (TAT 6-24 HRS): SARS Coronavirus 2: POSITIVE — AB

## 2020-05-01 MED ORDER — IBUPROFEN 800 MG PO TABS: 800.0000 mg | ORAL_TABLET | Freq: Three times a day (TID) | ORAL | 0 refills | Status: DC

## 2020-05-01 NOTE — ED Triage Notes (Signed)
Pt in with c/o fatigue and body aches that started yesterday  Pt took mucinex for sxs  Denies cough, congestion runny nose

## 2020-05-01 NOTE — ED Provider Notes (Signed)
  [facility]   [id number] 05/01/20 Arrival Time: 6213  ASSESSMENT & PLAN:  1. Fatigue, unspecified type   2. Body aches      COVID-19 testing sent. See letter/work note on file for self-isolation guidelines. OTC symptom care as needed.  Meds ordered this encounter  Medications  . ibuprofen (ADVIL) 800 MG tablet    Sig: Take 1 tablet (800 mg total) by mouth 3 (three) times daily with meals.    Dispense:  21 tablet    Refill:  0     Follow-up Information    [facility].   Specialty: Urgent Care Why: As needed. Contact information: [address] [place] [address] [phone]              Reviewed expectations re: course of current medical issues. Questions answered. Outlined signs and symptoms indicating need for more acute intervention. Understanding verbalized. After Visit Summary given.   SUBJECTIVE: History from: patient. Eric Mathis is a 35 y.o. male who presents with worries regarding COVID-19. Known COVID-19 contact: none but questions sick contacts. Recent travel: none. Reports: fatigue and body aches x 1 day. Denies: fever and difficulty breathing. Normal PO intake without n/v/d.    OBJECTIVE:  Vitals:   05/01/20 1223  BP: (!) 145/78  Pulse: 99  Resp: 20  Temp: 99.9 F (37.7 C)  SpO2: 98%    General appearance: alert; no distress Eyes: PERRLA; EOMI; conjunctiva normal HENT: [place]; AT; with nasal congestion Neck: supple  Lungs: speaks full sentences without difficulty; unlabored Extremities: no edema Skin: warm and dry Neurologic: normal gait Psychological: alert and cooperative; normal mood and affect  Labs:  Labs Reviewed  SARS CORONAVIRUS 2 (TAT 6-24 HRS)      No Known Allergies  History reviewed. No pertinent past medical history. Social History   Socioeconomic History  . Marital status: Single    Spouse name: Not on file  . Number of children: Not on file  . Years of  education: Not on file  . Highest education level: Not on file  Occupational History  . Not on file  Tobacco Use  . Smoking status: Current Every Day Smoker    Packs/day: 0.50    Types: Cigarettes  . Smokeless tobacco: Never Used  Vaping Use  . Vaping Use: Never used  Substance and Sexual Activity  . Alcohol use: No  . Drug use: No  . Sexual activity: Not on file  Other Topics Concern  . Not on file  Social History Narrative  . Not on file   Social Determinants of Health   Financial Resource Strain: Not on file  Food Insecurity: Not on file  Transportation Needs: Not on file  Physical Activity: Not on file  Stress: Not on file  Social Connections: Not on file  Intimate Partner Violence: Not on file   History reviewed. No pertinent family history. History reviewed. No pertinent surgical history.   Mardella Layman, MD 05/01/20 1250

## 2020-05-01 NOTE — Discharge Instructions (Signed)
You have been tested for COVID-19 today. °If your test returns positive, you will receive a phone call from [facility] regarding your results. °Negative test results are not called. °Both positive and negative results area always visible on MyChart. °If you do not have a MyChart account, sign up instructions are provided in your discharge papers. °Please do not hesitate to contact us should you have questions or concerns. ° °

## 2020-09-02 IMAGING — CR RIGHT ANKLE - COMPLETE 3+ VIEW
3 series · 3 of 3 positions shown · non-contrast
Comparison: None.

CLINICAL DATA: Pt c/o medial and lateral right ankle pain after
falling and twisting his ankle while at work today. No hx of prior
injuries or surgeries to the area. Patient and tech wore masks.

EXAM:
RIGHT ANKLE - COMPLETE 3+ VIEW

[ankle ap]
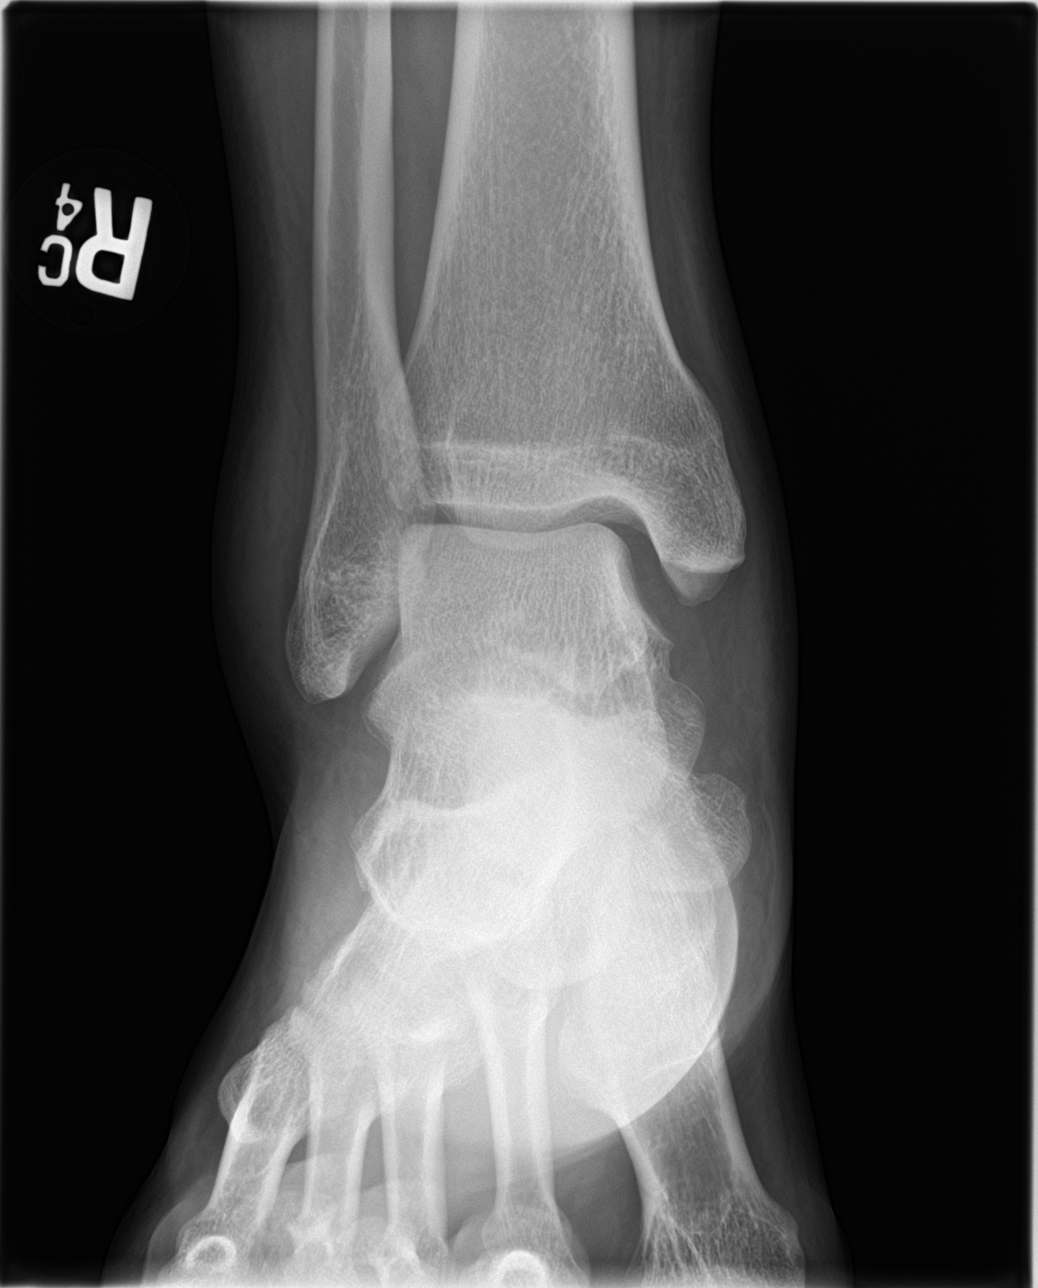

[ankle obl]
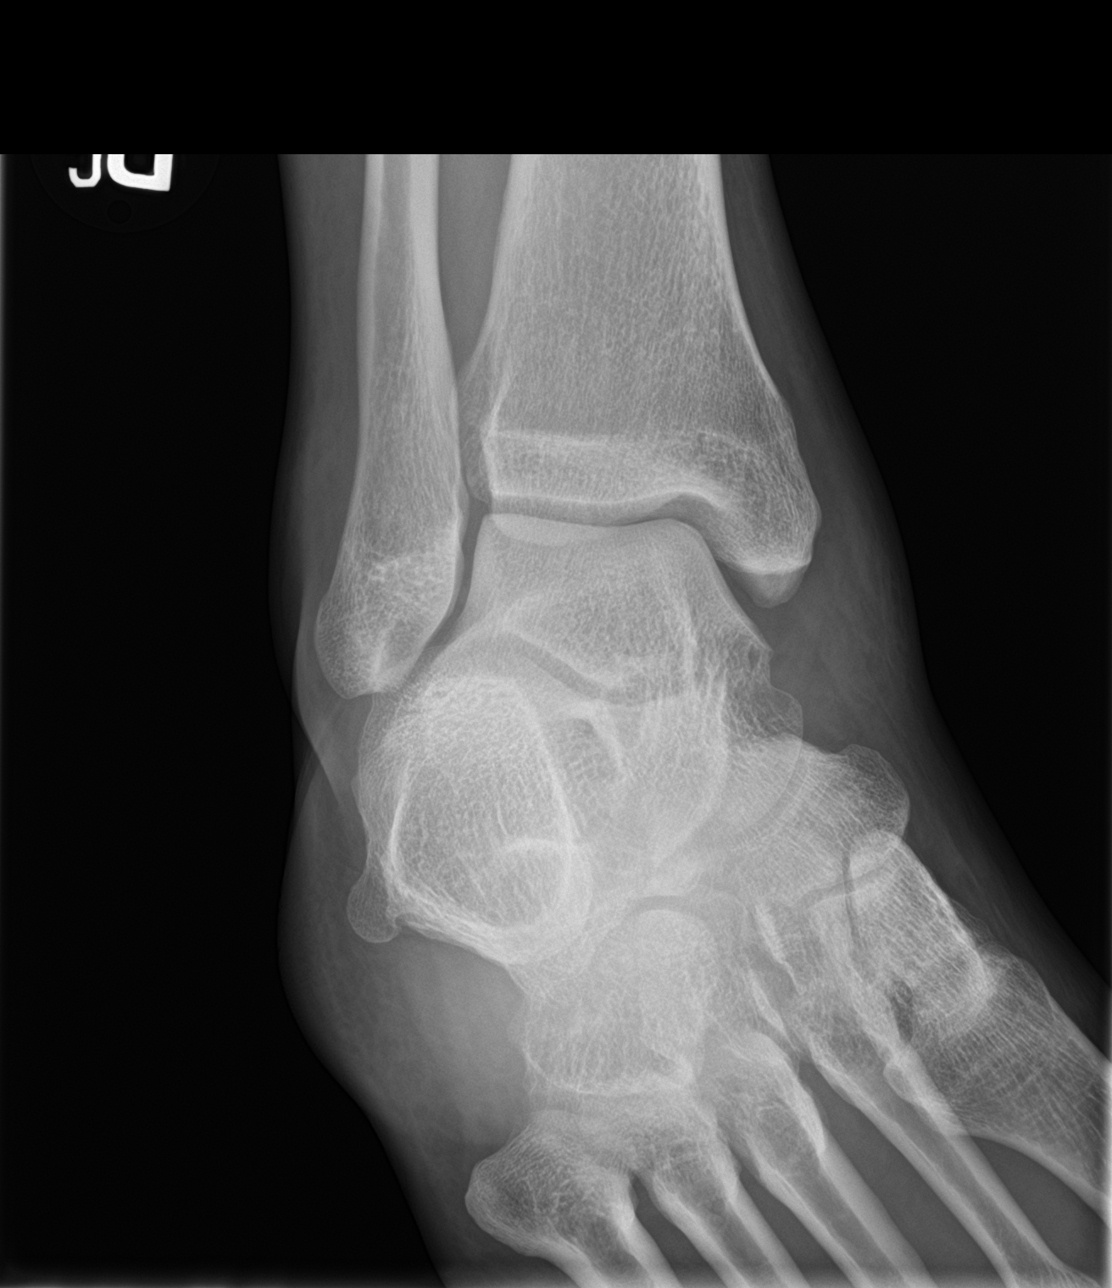

[ankle lat]
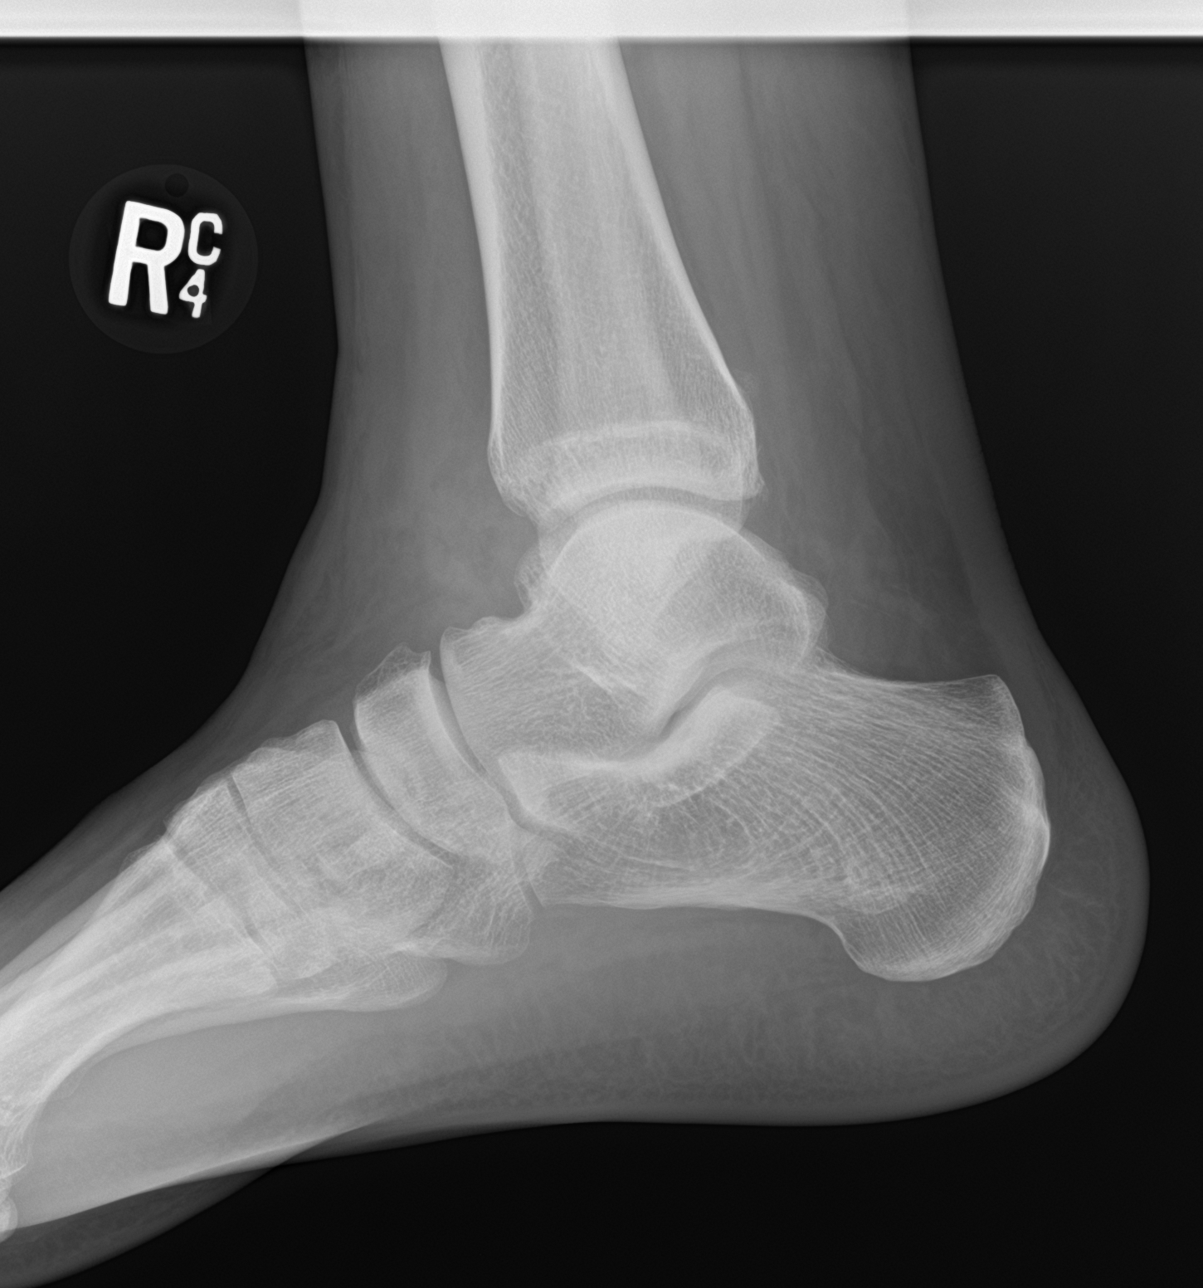

[3 of 3 positions shown; findings below may reference images not displayed]

FINDINGS: There is moderate soft tissue swelling of the ankle, LATERAL greater
than MEDIAL. There is no acute fracture or subluxation. The mortise
is intact. No radiopaque foreign body or soft tissue gas.
IMPRESSION: Soft tissue swelling.

## 2021-12-05 ENCOUNTER — Ambulatory Visit (HOSPITAL_COMMUNITY)
Admission: EM | Admit: 2021-12-05 | Discharge: 2021-12-05 | Disposition: A | Discharge status: Home / Self Care | Payer: Self-pay

## 2021-12-05 ENCOUNTER — Encounter (HOSPITAL_COMMUNITY): Payer: Self-pay | Admitting: Emergency Medicine

## 2021-12-05 DIAGNOSIS — R197 Diarrhea, unspecified: Secondary | ICD-10-CM

## 2021-12-05 DIAGNOSIS — R1084 Generalized abdominal pain: Secondary | ICD-10-CM

## 2021-12-05 DIAGNOSIS — R112 Nausea with vomiting, unspecified: Secondary | ICD-10-CM

## 2021-12-05 NOTE — Discharge Instructions (Addendum)
Recommend continue to slowly push fluids- mainly water and tea/juice. Start eating bland foods such as toast, crackers, rice, mashed potatoes and eat other foods as tolerated. Avoid any fried foods, acidic foods or any foods that have a lot of fat for the next 48 hours. If abdominal pain returns with vomiting and unable to keep any fluids down, go to the ER ASAP. Otherwise, follow up as needed.

## 2021-12-05 NOTE — ED Triage Notes (Signed)
Pt reports generalized abdominal pain yesterday after eating a sandwich. States the sandwich contained mayo and was left in the car. Reports two episodes of emesis last night. Currently denying abdominal pain at the moment.

## 2021-12-05 NOTE — ED Provider Notes (Signed)
[facility]    CSN: [id number] Arrival date & time: 12/05/21  1004      History   Chief Complaint Chief Complaint  Patient presents with   Abdominal Pain   Nausea    HPI Eric Mathis is a 36 y.o. male.   36 year old male presents with abdominal pain, nausea and vomiting yesterday after eating a sandwich with chicken and mayo that had been sitting in his car. He also experienced some diarrhea. Last episode of emesis around 3am, last episode of diarrhea about 4 hours ago at 6am. Denies any fever, cough, dysuria, or blood in his stool. Has been drinking some water and tea and able to keep down fluids. Has not taken any medication for symptoms. No other family members or co-workers ill. Is tired but otherwise abdominal pain has improved and only experiencing mild nausea now. No other chronic health issues. Takes no daily medication.   The history is provided by the patient.    History reviewed. No pertinent past medical history.  There are no problems to display for this patient.   History reviewed. No pertinent surgical history.     Home Medications    Prior to Admission medications   Not on File    Family History History reviewed. No pertinent family history.  Social History Social History   Tobacco Use   Smoking status: Every Day    Packs/day: 0.50    Types: Cigarettes   Smokeless tobacco: Never  Vaping Use   Vaping Use: Never used  Substance Use Topics   Alcohol use: No   Drug use: No     Allergies   Patient has no known allergies.   Review of Systems Review of Systems  Constitutional:  Positive for appetite change and fatigue. Negative for chills, diaphoresis and fever.  HENT:  Negative for congestion, mouth sores, rhinorrhea, sore throat and trouble swallowing.   Respiratory:  Negative for cough, chest tightness and shortness of breath.   Gastrointestinal:  Positive for abdominal pain, diarrhea, nausea and vomiting. Negative for  blood in stool.  Genitourinary:  Negative for decreased urine volume, difficulty urinating, dysuria, flank pain, frequency, hematuria and urgency.  Musculoskeletal:  Negative for arthralgias, back pain and myalgias.  Skin:  Negative for color change and rash.  Allergic/Immunologic: Negative for environmental allergies, food allergies and immunocompromised state.  Neurological:  Negative for dizziness, tremors, seizures, syncope, speech difficulty, light-headedness and headaches.  Hematological:  Negative for adenopathy. Does not bruise/bleed easily.     Physical Exam Triage Vital Signs ED Triage Vitals  Enc Vitals Group     BP 12/05/21 1044 127/85     Pulse Rate 12/05/21 1044 95     Resp 12/05/21 1044 18     Temp 12/05/21 1044 98.3 F (36.8 C)     Temp Source 12/05/21 1044 Oral     SpO2 12/05/21 1044 96 %     Weight --      Height --      Head Circumference --      Peak Flow --      Pain Score 12/05/21 1043 0     Pain Loc --      Pain Edu? --      Excl. in GC? --    No data found.  Updated Vital Signs BP 127/85 (BP Location: Right Arm)   Pulse 95   Temp 98.3 F (36.8 C) (Oral)   Resp 18   SpO2 96%  Visual Acuity Right Eye Distance:   Left Eye Distance:   Bilateral Distance:    Right Eye Near:   Left Eye Near:    Bilateral Near:     Physical Exam Vitals and nursing note reviewed.  Constitutional:      General: He is awake. He is not in acute distress.    Appearance: He is well-developed and well-groomed.     Comments: He is sitting on the exam table in no acute distress but appears tired.   HENT:     Head: Normocephalic and atraumatic.     Right Ear: Hearing, tympanic membrane, ear canal and external ear normal.     Left Ear: Hearing, tympanic membrane, ear canal and external ear normal.     Nose: Nose normal.     Right Sinus: No maxillary sinus tenderness or frontal sinus tenderness.     Left Sinus: No maxillary sinus tenderness or frontal sinus  tenderness.     Mouth/Throat:     Lips: Pink.     Mouth: Mucous membranes are moist.     Pharynx: Oropharynx is clear. Uvula midline.  Eyes:     Extraocular Movements: Extraocular movements intact.     Conjunctiva/sclera: Conjunctivae normal.  Cardiovascular:     Rate and Rhythm: Normal rate and regular rhythm.     Heart sounds: Normal heart sounds. No murmur heard. Pulmonary:     Effort: Pulmonary effort is normal. No respiratory distress.     Breath sounds: Normal breath sounds and air entry. No decreased air movement. No decreased breath sounds, wheezing, rhonchi or rales.  Abdominal:     General: Bowel sounds are normal.     Palpations: Abdomen is soft.     Tenderness: There is no abdominal tenderness. There is no right CVA tenderness, left CVA tenderness, guarding or rebound.     Comments: Generalized abdominal minimal tenderness present.   Musculoskeletal:        General: Normal range of motion.     Cervical back: Normal range of motion and neck supple.  Lymphadenopathy:     Cervical: No cervical adenopathy.  Skin:    General: Skin is warm and dry.     Capillary Refill: Capillary refill takes less than 2 seconds.     Findings: No rash.  Neurological:     General: No focal deficit present.     Mental Status: He is alert and oriented to person, place, and time.  Psychiatric:        Mood and Affect: Mood normal.        Behavior: Behavior normal. Behavior is cooperative.        Thought Content: Thought content normal.        Judgment: Judgment normal.      UC Treatments / Results  Labs (all labs ordered are listed, but only abnormal results are displayed) Labs Reviewed - No data to display  EKG   Radiology No results found.  Procedures Procedures (including critical care time)  Medications Ordered in UC Medications - No data to display  Initial Impression / Assessment and Plan / UC Course  I have reviewed the triage vital signs and the nursing  notes.  Pertinent labs & imaging results that were available during my care of the patient were reviewed by me and considered in my medical decision making (see chart for details).     Reviewed with patient that he probably has Gastroenteritis/gastritis from chicken/mayo or a virus- symptoms mainly resolved. Patient declined  any nausea medication. Recommend continue to slowly push fluids- mainly water and tea/juices. Avoid acidic drinks and food. Start a bland diet and advance as tolerated. Note written for work. If abdominal pain or vomiting returns and unable to keep down any fluids, go to the ER ASAP. Otherwise, follow-up as needed.  Final Clinical Impressions(s) / UC Diagnoses   Final diagnoses:  Generalized abdominal pain  Nausea vomiting and diarrhea     Discharge Instructions      Recommend continue to slowly push fluids- mainly water and tea/juice. Start eating bland foods such as toast, crackers, rice, mashed potatoes and eat other foods as tolerated. Avoid any fried foods, acidic foods or any foods that have a lot of fat for the next 48 hours. If abdominal pain returns with vomiting and unable to keep any fluids down, go to the ER ASAP. Otherwise, follow up as needed.     ED Prescriptions   None    PDMP not reviewed this encounter.   Sudie Grumbling, NP 12/06/21 [phone]

## 2022-01-23 ENCOUNTER — Ambulatory Visit
Admission: EM | Admit: 2022-01-23 | Discharge: 2022-01-23 | Disposition: A | Discharge status: Home / Self Care | Payer: Self-pay | Attending: Physician Assistant | Admitting: Physician Assistant

## 2022-01-23 DIAGNOSIS — J069 Acute upper respiratory infection, unspecified: Secondary | ICD-10-CM | POA: Insufficient documentation

## 2022-01-23 DIAGNOSIS — Z1152 Encounter for screening for COVID-19: Secondary | ICD-10-CM | POA: Insufficient documentation

## 2022-01-23 LAB — RESP PANEL BY RT-PCR (FLU A&B, COVID) ARPGX2
Influenza A by PCR: NEGATIVE
Influenza B by PCR: NEGATIVE
SARS Coronavirus 2 by RT PCR: NEGATIVE

## 2022-01-23 NOTE — ED Triage Notes (Signed)
Pt presents with generalized body aches and nasal congestion since yesterday.

## 2022-01-23 NOTE — ED Provider Notes (Signed)
[facility]    CSN: [id number] Arrival date & time: 01/23/22  1124      History   Chief Complaint Chief Complaint  Patient presents with   Generalized Body Aches   URI    HPI Eric Mathis is a 36 y.o. male.   Patient here today for evaluation of generalized body aches, nasal congestion and cough that started yesterday.  He reports he has had some sore throat as well.  He denies any nausea, vomiting or diarrhea.  He has not had any ear pain.  He has not tried any medication for symptoms.  The history is provided by the patient.  URI Presenting symptoms: congestion, cough and sore throat   Presenting symptoms: no ear pain and no fever   Associated symptoms: myalgias     History reviewed. No pertinent past medical history.  There are no problems to display for this patient.   History reviewed. No pertinent surgical history.     Home Medications    Prior to Admission medications   Not on File    Family History Family History  Family history unknown: Yes    Social History Social History   Tobacco Use   Smoking status: Every Day    Packs/day: 0.50    Types: Cigarettes   Smokeless tobacco: Never  Vaping Use   Vaping Use: Never used  Substance Use Topics   Alcohol use: No   Drug use: No     Allergies   Patient has no known allergies.   Review of Systems Review of Systems  Constitutional:  Negative for chills and fever.  HENT:  Positive for congestion and sore throat. Negative for ear pain.   Eyes:  Negative for discharge and redness.  Respiratory:  Positive for cough. Negative for shortness of breath.   Gastrointestinal:  Negative for abdominal pain, diarrhea, nausea and vomiting.  Musculoskeletal:  Positive for myalgias.     Physical Exam Triage Vital Signs ED Triage Vitals  Enc Vitals Group     BP 01/23/22 1132 132/87     Pulse Rate 01/23/22 1132 85     Resp 01/23/22 1132 17     Temp 01/23/22 1132 97.8 F (36.6 C)      Temp Source 01/23/22 1132 Oral     SpO2 01/23/22 1132 96 %     Weight --      Height --      Head Circumference --      Peak Flow --      Pain Score 01/23/22 1131 5     Pain Loc --      Pain Edu? --      Excl. in [place]? --    No data found.  Updated Vital Signs BP 132/87 (BP Location: Left Arm)   Pulse 85   Temp 97.8 F (36.6 C) (Oral)   Resp 17   SpO2 96%      Physical Exam Vitals and nursing note reviewed.  Constitutional:      General: He is not in acute distress.    Appearance: Normal appearance. He is not ill-appearing.  HENT:     Head: Normocephalic and atraumatic.     Nose: Congestion present.     Mouth/Throat:     Mouth: Mucous membranes are moist.     Pharynx: Oropharynx is clear. Posterior oropharyngeal erythema present. No oropharyngeal exudate.  Eyes:     Conjunctiva/sclera: Conjunctivae normal.  Cardiovascular:     Rate and Rhythm: Normal  rate and regular rhythm.     Heart sounds: Normal heart sounds. No murmur heard. Pulmonary:     Effort: Pulmonary effort is normal. No respiratory distress.     Breath sounds: Normal breath sounds. No wheezing, rhonchi or rales.  Skin:    General: Skin is warm and dry.  Neurological:     Mental Status: He is alert.  Psychiatric:        Mood and Affect: Mood normal.        Thought Content: Thought content normal.      UC Treatments / Results  Labs (all labs ordered are listed, but only abnormal results are displayed) Labs Reviewed  RESP PANEL BY RT-PCR (FLU A&B, COVID) [id number]    EKG   Radiology No results found.  Procedures Procedures (including critical care time)  Medications Ordered in UC Medications - No data to display  Initial Impression / Assessment and Plan / UC Course  I have reviewed the triage vital signs and the nursing notes.  Pertinent labs & imaging results that were available during my care of the patient were reviewed by me and considered in my medical decision making (see chart for  details).    Suspect likely viral etiology of symptoms.  Recommended symptomatic treatment, increase rest and fluids.  Will order COVID and flu screening.  Will await results for further recommendation but encouraged follow-up with any further concerns.  Final Clinical Impressions(s) / UC Diagnoses   Final diagnoses:  Acute upper respiratory infection  Encounter for screening for COVID-19   Discharge Instructions   None    ED Prescriptions   None    PDMP not reviewed this encounter.   Francene Finders, PA-C 01/23/22 1151

## 2022-04-15 ENCOUNTER — Ambulatory Visit
Admission: EM | Admit: 2022-04-15 | Discharge: 2022-04-15 | Disposition: A | Discharge status: Home / Self Care | Payer: Self-pay | Attending: Internal Medicine | Admitting: Internal Medicine

## 2022-04-15 DIAGNOSIS — R197 Diarrhea, unspecified: Secondary | ICD-10-CM

## 2022-04-15 DIAGNOSIS — R112 Nausea with vomiting, unspecified: Secondary | ICD-10-CM

## 2022-04-15 DIAGNOSIS — A084 Viral intestinal infection, unspecified: Secondary | ICD-10-CM

## 2022-04-15 MED ORDER — ONDANSETRON 4 MG PO TBDP: 4.0000 mg | ORAL_TABLET | Freq: Three times a day (TID) | ORAL | 0 refills | Status: AC | PRN

## 2022-04-15 NOTE — ED Triage Notes (Signed)
Pt c/o nausea and vomiting after eating breakfast this morning with diarrhea. States was able to eat a small lunch.

## 2022-04-15 NOTE — Discharge Instructions (Signed)
It appears that you have a viral stomach virus.  I have prescribed you nausea medication to take as needed.  Ensure adequate fluid hydration and bland diet.  Follow-up if symptoms persist or worsen.

## 2022-04-15 NOTE — ED Provider Notes (Signed)
[facility]    CSN: [id number] Arrival date & time: 04/15/22  1644      History   Chief Complaint Chief Complaint  Patient presents with   Emesis    HPI Eric Mathis is a 36 y.o. male.   Patient presents with nausea, vomiting, diarrhea that started this morning.  Patient denies blood in stool or emesis.  Denies associated cough, upper respiratory symptoms, fever, abdominal pain.  Patient has been able to keep food and fluids down.  He denies any known sick contacts, recent unfavorable foods, travel outside [place].   Emesis   History reviewed. No pertinent past medical history.  There are no problems to display for this patient.   History reviewed. No pertinent surgical history.     Home Medications    Prior to Admission medications   Medication Sig Start Date End Date Taking? Authorizing Provider  ondansetron (ZOFRAN-ODT) 4 MG disintegrating tablet Take 1 tablet (4 mg total) by mouth every 8 (eight) hours as needed for nausea or vomiting. 04/15/22  Yes Gustavus Bryant, FNP    Family History Family History  Family history unknown: Yes    Social History Social History   Tobacco Use   Smoking status: Every Day    Packs/day: 0.50    Types: Cigarettes   Smokeless tobacco: Never  Vaping Use   Vaping Use: Never used  Substance Use Topics   Alcohol use: No   Drug use: No     Allergies   Patient has no known allergies.   Review of Systems Review of Systems Per HPI  Physical Exam Triage Vital Signs ED Triage Vitals  Enc Vitals Group     BP 04/15/22 1743 134/86     Pulse Rate 04/15/22 1743 91     Resp 04/15/22 1743 16     Temp 04/15/22 1743 98.3 F (36.8 C)     Temp Source 04/15/22 1743 Oral     SpO2 04/15/22 1743 98 %     Weight --      Height --      Head Circumference --      Peak Flow --      Pain Score 04/15/22 1744 0     Pain Loc --      Pain Edu? --      Excl. in [place]? --    No data found.  Updated Vital  Signs BP 134/86 (BP Location: Left Arm)   Pulse 91   Temp 98.3 F (36.8 C) (Oral)   Resp 16   SpO2 98%   Visual Acuity Right Eye Distance:   Left Eye Distance:   Bilateral Distance:    Right Eye Near:   Left Eye Near:    Bilateral Near:     Physical Exam Constitutional:      General: He is not in acute distress.    Appearance: Normal appearance. He is not toxic-appearing or diaphoretic.  HENT:     Head: Normocephalic and atraumatic.     Mouth/Throat:     Mouth: Mucous membranes are moist.     Pharynx: No posterior oropharyngeal erythema.  Eyes:     Extraocular Movements: Extraocular movements intact.     Conjunctiva/sclera: Conjunctivae normal.  Cardiovascular:     Rate and Rhythm: Normal rate and regular rhythm.     Pulses: Normal pulses.     Heart sounds: Normal heart sounds.  Pulmonary:     Effort: Pulmonary effort is normal. No respiratory distress.  Breath sounds: Normal breath sounds.  Abdominal:     General: Bowel sounds are normal. There is no distension.     Palpations: Abdomen is soft.     Tenderness: There is no abdominal tenderness.  Neurological:     General: No focal deficit present.     Mental Status: He is alert and oriented to person, place, and time. Mental status is at baseline.  Psychiatric:        Mood and Affect: Mood normal.        Behavior: Behavior normal.        Thought Content: Thought content normal.        Judgment: Judgment normal.      UC Treatments / Results  Labs (all labs ordered are listed, but only abnormal results are displayed) Labs Reviewed - No data to display  EKG   Radiology No results found.  Procedures Procedures (including critical care time)  Medications Ordered in UC Medications - No data to display  Initial Impression / Assessment and Plan / UC Course  I have reviewed the triage vital signs and the nursing notes.  Pertinent labs & imaging results that were available during my care of the patient  were reviewed by me and considered in my medical decision making (see chart for details).     Differential diagnoses include viral gastroenteritis versus food related illness.  Will treat with ondansetron.  Advised supportive care, clear oral fluid intake, bland diet.  There are no signs of acute abdomen or dehydration on exam so do not think that emergent evaluation is necessary.  Patient was advised to follow-up if symptoms persist or worsen.  Patient verbalized understanding and was agreeable with plan. Final Clinical Impressions(s) / UC Diagnoses   Final diagnoses:  Viral gastroenteritis  Nausea vomiting and diarrhea     Discharge Instructions      It appears that you have a viral stomach virus.  I have prescribed you nausea medication to take as needed.  Ensure adequate fluid hydration and bland diet.  Follow-up if symptoms persist or worsen.    ED Prescriptions     Medication Sig Dispense Auth. Provider   ondansetron (ZOFRAN-ODT) 4 MG disintegrating tablet Take 1 tablet (4 mg total) by mouth every 8 (eight) hours as needed for nausea or vomiting. 20 tablet Archer, Acie Fredrickson, [place]      PDMP not reviewed this encounter.   Gustavus Bryant, [place] 04/15/22 [phone]
